# Patient Record
Sex: Male | Born: 1980 | Hispanic: Yes | Marital: Single | Smoking: Current every day smoker
Health system: Southern US, Community
[De-identification: ages and names within clinical notes are randomized; demographics above are authoritative.]

## PROBLEM LIST (undated history)

## (undated) DIAGNOSIS — E119 Type 2 diabetes mellitus without complications: Secondary | ICD-10-CM

## (undated) HISTORY — PX: HAND SURGERY: SHX662

---

## 2016-04-21 ENCOUNTER — Emergency Department (HOSPITAL_COMMUNITY): Payer: Worker's Compensation

## 2016-04-21 ENCOUNTER — Encounter (HOSPITAL_COMMUNITY): Payer: Self-pay | Admitting: *Deleted

## 2016-04-21 ENCOUNTER — Emergency Department (HOSPITAL_COMMUNITY)
Admission: EM | Admit: 2016-04-21 | Discharge: 2016-04-21 | Disposition: A | Discharge status: Home / Self Care | Payer: Worker's Compensation | Attending: Emergency Medicine | Admitting: Emergency Medicine

## 2016-04-21 DIAGNOSIS — Y9289 Other specified places as the place of occurrence of the external cause: Secondary | ICD-10-CM | POA: Insufficient documentation

## 2016-04-21 DIAGNOSIS — Y9389 Activity, other specified: Secondary | ICD-10-CM | POA: Diagnosis not present

## 2016-04-21 DIAGNOSIS — Y999 Unspecified external cause status: Secondary | ICD-10-CM | POA: Diagnosis not present

## 2016-04-21 DIAGNOSIS — W293XXA Contact with powered garden and outdoor hand tools and machinery, initial encounter: Secondary | ICD-10-CM | POA: Insufficient documentation

## 2016-04-21 DIAGNOSIS — S96021A Laceration of muscle and tendon of long flexor muscle of toe at ankle and foot level, right foot, initial encounter: Secondary | ICD-10-CM | POA: Diagnosis not present

## 2016-04-21 DIAGNOSIS — S92411B Displaced fracture of proximal phalanx of right great toe, initial encounter for open fracture: Secondary | ICD-10-CM

## 2016-04-21 DIAGNOSIS — S91119A Laceration without foreign body of unspecified toe without damage to nail, initial encounter: Secondary | ICD-10-CM

## 2016-04-21 DIAGNOSIS — S99921A Unspecified injury of right foot, initial encounter: Secondary | ICD-10-CM | POA: Diagnosis present

## 2016-04-21 DIAGNOSIS — Z23 Encounter for immunization: Secondary | ICD-10-CM | POA: Diagnosis not present

## 2016-04-21 DIAGNOSIS — F17200 Nicotine dependence, unspecified, uncomplicated: Secondary | ICD-10-CM | POA: Diagnosis not present

## 2016-04-21 DIAGNOSIS — E119 Type 2 diabetes mellitus without complications: Secondary | ICD-10-CM | POA: Diagnosis not present

## 2016-04-21 HISTORY — DX: Type 2 diabetes mellitus without complications: E11.9

## 2016-04-21 LAB — CBG MONITORING, ED: GLUCOSE-CAPILLARY: 110 mg/dL — AB (ref 65–99)

## 2016-04-21 MED ORDER — AMOXICILLIN-POT CLAVULANATE 875-125 MG PO TABS: 1.0000 | ORAL_TABLET | Freq: Two times a day (BID) | ORAL | 0 refills | Status: AC

## 2016-04-21 MED ORDER — HYDROCODONE-ACETAMINOPHEN 5-325 MG PO TABS: 1.0000 | ORAL_TABLET | Freq: Four times a day (QID) | ORAL | 0 refills | Status: AC | PRN

## 2016-04-21 MED ORDER — CEFAZOLIN SODIUM-DEXTROSE 2-4 GM/100ML-% IV SOLN
2.0000 g | Freq: Once | INTRAVENOUS | Status: AC
Start: 2016-04-21 — End: 2016-04-21
  Administered 2016-04-21: 2 g via INTRAVENOUS
  Filled 2016-04-21: qty 100

## 2016-04-21 MED ORDER — TETANUS-DIPHTH-ACELL PERTUSSIS 5-2.5-18.5 LF-MCG/0.5 IM SUSP
0.5000 mL | Freq: Once | INTRAMUSCULAR | Status: AC
  Administered 2016-04-21: 0.5 mL via INTRAMUSCULAR
  Filled 2016-04-21: qty 0.5

## 2016-04-21 MED ORDER — HYDROCODONE-ACETAMINOPHEN 5-325 MG PO TABS
1.0000 | ORAL_TABLET | Freq: Once | ORAL | Status: AC
  Administered 2016-04-21: 1 via ORAL
  Filled 2016-04-21: qty 1

## 2016-04-21 MED ORDER — LIDOCAINE HCL (PF) 1 % IJ SOLN
5.0000 mL | Freq: Once | INTRAMUSCULAR | Status: AC
  Administered 2016-04-21: 5 mL
  Filled 2016-04-21: qty 5

## 2016-04-21 NOTE — ED Notes (Signed)
Patient transported to X-ray 

## 2016-04-21 NOTE — ED Notes (Signed)
Translator at bedside, edp made aware

## 2016-04-21 NOTE — ED Notes (Signed)
Ortho at bedside.

## 2016-04-21 NOTE — Discharge Instructions (Addendum)
Wash wounds daily in shower with soap and water. Do not soak. Apply antibiotic ointment (e.g. Neosporin) twice daily and as needed to keep moist. Cover with dry dressing.  Walk as tolerated in hard-soled shoe.  No work until you see Dr. Magnus Ivan in office.  Lave las heridas diariamente en la ducha con agua y [place]. No remojar. Aplique ungento antibitico (por ejemplo, Neosporin) dos veces al da y segn sea necesario para mantenerse hmedo. Cubra con aderezo seco.  Camine segn lo tolere un zapato con suela dura.  No trabajar hasta que vea al Dr. Magnus Ivan en el cargo.

## 2016-04-21 NOTE — ED Triage Notes (Signed)
Pt states was using chainsaw to cut tree and saw broke, cutting through boot and 2 pairs of soc.  Lac noted to great toe.  Pt states able to move.  Hx of diabetes that pt does not take meds for.  cbg 110 in triage.

## 2016-04-21 NOTE — ED Provider Notes (Signed)
[facility]-EMERGENCY DEPT Provider Note   CSN: [id number] Arrival date & time: 04/21/16  1102     History   Chief Complaint Chief Complaint  Patient presents with  . Extremity Laceration    HPI Randy Hunter Randy Hunter is a 36 y.o. male.  Pt presents w laceration to R 1st toe. Injury occurred just prior to arrival; pt was using a chainsaw at work when it "bumped around" and landed on his toe. Pt reports pain is constant, 7/10 severity, w numbness surrounding the wound. Denies any other trauma. Denies daily medications. Last tetanus vaccine was at age 35. Per chart, PMHx DM, pt reports this is new to him as of today. Current everyday smoker.    Due to language barrier, an interpreter was present during the history-taking and subsequent discussion (and for part of the physical exam) with this patient.   Past Medical History:  Diagnosis Date  . Diabetes mellitus without complication (HCC)     There are no active problems to display for this patient.   Past Surgical History:  Procedure Laterality Date  . HAND SURGERY Right        Home Medications    Prior to Admission medications   Medication Sig Start Date End Date Taking? Authorizing Provider  amoxicillin-clavulanate (AUGMENTIN) 875-125 MG tablet Take 1 tablet by mouth every 12 (twelve) hours. 04/21/16   [place] N Russo, PA-C  HYDROcodone-acetaminophen (NORCO/VICODIN) 5-325 MG tablet Take 1-2 tablets by mouth every 6 (six) hours as needed for severe pain. 04/21/16   [place] N Russo, PA-C    Family History No family history on file.  Social History Social History  Substance Use Topics  . Smoking status: Current Every Day Smoker    Packs/day: 0.50  . Smokeless tobacco: Never Used  . Alcohol use Yes     Comment: occ     Allergies   Patient has no known allergies.   Review of Systems Review of Systems  Musculoskeletal: Positive for arthralgias (R 1st toe).  Skin: Positive for wound (R 1st toe).  Neurological:  Positive for numbness.     Physical Exam Updated Vital Signs BP 121/85   Pulse 63   Temp 97.8 F (36.6 C) (Oral)   Resp 18   Ht  (1.93 m)   Wt 77.1 kg   SpO2 100%   BMI 20.69 kg/m   Physical Exam  Constitutional: He appears well-developed and well-nourished.  HENT:  Head: Normocephalic and atraumatic.  Eyes: Conjunctivae are normal.  Cardiovascular: Normal rate and intact distal pulses.   Pulmonary/Chest: Effort normal.  Musculoskeletal: He exhibits tenderness. He exhibits no deformity.  Pt w dec AROM of R 1st toe, assoc edema.   Neurological: A sensory deficit (dec sensation medial and lateral aspect R ankle and R 1st toe, normal sensation dorsal foot) is present.  Skin:  2-2.5cm laceration dorsal aspect of base of 1st R toe.   Psychiatric: He has a normal mood and affect. His behavior is normal.  Nursing note and vitals reviewed.    ED Treatments / Results  Labs (all labs ordered are listed, but only abnormal results are displayed) Labs Reviewed  CBG MONITORING, ED - Abnormal; Notable for the following:       Result Value   Glucose-Capillary 110 (*)    All other components within normal limits    EKG  EKG Interpretation None       Radiology Dg Foot Complete Right  Result Date: 04/21/2016 CLINICAL DATA:  Using  saw today patient cut rt big toe EXAM: RIGHT FOOT COMPLETE - 3+ VIEW COMPARISON:  None. FINDINGS: Three views of the right foot submitted. Consistent with provided history there is a linear oblique cortical defect medial aspect mid shaft of proximal phalanx great toe. Open fracture is highly suspected probable from bony injury from saw blade. IMPRESSION: Consistent with provided history there is a linear oblique cortical defect medial aspect mid shaft of proximal phalanx great toe. Open fracture is highly suspected probable from bony injury from saw blade. These results were called by telephone at the time of interpretation on 04/21/2016 at 12:40 pm to  Dr. Crista Curb , who verbally acknowledged these results. Electronically Signed   By: Natasha Mead M.D.   On: 04/21/2016 12:40    Procedures .Marland KitchenLaceration Repair Date/Time: 04/21/2016 4:16 PM Performed by: RUSSO, [place] N Authorized by: RUSSO, [place] N   Consent:    Consent obtained:  Verbal   Consent given by:  Patient   Risks discussed:  Infection, retained foreign body, tendon damage, pain and poor wound healing   Alternatives discussed:  No treatment Anesthesia (see MAR for exact dosages):    Anesthesia method:  Local infiltration   Local anesthetic:  Lidocaine 1% w/o epi Laceration details:    Location:  Toe   Toe location:  R big toe   Length (cm):  2.5   Depth (mm):  0.5 Repair type:    Repair type:  Simple Pre-procedure details:    Preparation:  Patient was prepped and draped in usual sterile fashion Exploration:    Hemostasis achieved with:  Direct pressure   Wound exploration: wound explored through full range of motion and entire depth of wound probed and visualized     Wound extent: tendon damage     Wound extent: no foreign bodies/material noted     Tendon damage location:  Lower extremity   Lower extremity tendon damage location:  Extensor tendon of 1st toe   Tendon damage extent:  Partial transection   Tendon repair plan:  Refer for evaluation   Contaminated: no   Treatment:    Area cleansed with:  Saline   Amount of cleaning:  Extensive   Irrigation solution:  Sterile saline   Irrigation volume:  1L   Irrigation method:  Syringe   Visualized foreign bodies/material removed: no   Skin repair:    Repair method:  Sutures   Suture size:  5-0   Suture material:  Prolene   Suture technique:  Simple interrupted   Number of sutures:  7 Approximation:    Approximation:  Loose   Vermilion border: well-aligned   Post-procedure details:    Dressing:  Non-adherent dressing   Patient tolerance of procedure:  Tolerated well, no immediate complications   (including  critical care time)  Medications Ordered in ED Medications  Tdap (BOOSTRIX) injection 0.5 mL (0.5 mLs Intramuscular Given 04/21/16 1321)  HYDROcodone-acetaminophen (NORCO/VICODIN) 5-325 MG per tablet 1 tablet (1 tablet Oral Given 04/21/16 1321)  ceFAZolin (ANCEF) IVPB 2g/100 mL premix (0 g Intravenous Stopped 04/21/16 1606)  lidocaine (PF) (XYLOCAINE) 1 % injection 5 mL (5 mLs Infiltration Given 04/21/16 1617)     Initial Impression / Assessment and Plan / ED Course  I have reviewed the triage vital signs and the nursing notes.  Pertinent labs & imaging results that were available during my care of the patient were reviewed by me and considered in my medical decision making (see chart for details).  Pt w laceration to base of R 1st toe w assoc cortical defect and possible open fracture. Pressure irrigation performed. Wound explored and base of wound visualized in a bloodless field without evidence of foreign body. Tendon damage noted. Repaired lac w loose approximation, well-aligned. Laceration occurred < 8 hours prior to ED arrival. Tdap updated. Orthopedic surgery consulted and suggest orthopedic boot, dose of IV abx and outpatient follow up. Will discharge w Augmentin, Norco for pain, boot and referral.   Discussed suture home care with patient and answered questions. Pt to follow-up w orthopedic surgery. They are to return to the ED sooner for signs of infection. Pt is hemodynamically stable with no complaints prior to dc.   Patient discussed with and seen by Dr. Verdie Mosher. Discussed results, findings, treatment and follow up. Patient advised of return precautions. Patient verbalized understanding and agreed with plan.    Final Clinical Impressions(s) / ED Diagnoses   Final diagnoses:  Laceration of toe of right foot with complication, initial encounter  Open fracture of proximal phalanx of right great toe, physeal involvement unspecified, initial encounter    New Prescriptions New  Prescriptions   AMOXICILLIN-CLAVULANATE (AUGMENTIN) 875-125 MG TABLET    Take 1 tablet by mouth every 12 (twelve) hours.   HYDROCODONE-ACETAMINOPHEN (NORCO/VICODIN) 5-325 MG TABLET    Take 1-2 tablets by mouth every 6 (six) hours as needed for severe pain.     [place] N Russo, PA-C 04/21/16 1626    Lavera Guise, MD 04/21/16 2023

## 2016-05-01 ENCOUNTER — Ambulatory Visit (INDEPENDENT_AMBULATORY_CARE_PROVIDER_SITE_OTHER): Payer: Self-pay

## 2016-05-01 ENCOUNTER — Ambulatory Visit (INDEPENDENT_AMBULATORY_CARE_PROVIDER_SITE_OTHER): Payer: Self-pay | Admitting: Orthopedic Surgery

## 2016-05-01 DIAGNOSIS — S96921D Laceration of unspecified muscle and tendon at ankle and foot level, right foot, subsequent encounter: Secondary | ICD-10-CM

## 2016-05-01 DIAGNOSIS — S96121A Laceration of muscle and tendon of long extensor muscle of toe at ankle and foot level, right foot, initial encounter: Secondary | ICD-10-CM

## 2016-05-01 DIAGNOSIS — M79674 Pain in right toe(s): Secondary | ICD-10-CM

## 2016-05-01 DIAGNOSIS — S96821D Laceration of other specified muscles and tendons at ankle and foot level, right foot, subsequent encounter: Secondary | ICD-10-CM | POA: Insufficient documentation

## 2016-05-01 NOTE — Progress Notes (Signed)
Office Visit Note   Patient: Randy Hunter           Date of Birth: Feb 05, 1980           MRN: [id number] Visit Date: 05/01/2016              Requested by: No referring provider defined for this encounter. PCP: No PCP Per Patient  No chief complaint on file.     HPI: Patient is a 36 year old gentleman who was using a chainsaw the chainsaw cut through his shoe and through this skin of his foot. Patient was seen in emergency room the wound was washed out the skin was closed and patient is seen today for initial evaluation. Initial injury was last Thursday. Patient did receive a tetanus shot and IV antibiotics.  Assessment & Plan: Visit Diagnoses:  1. Great toe pain, right   2. Laceration of extensor hallucis longus tendon, right, initial encounter     Plan: Recommend proceeding with exploration and reconstruction of extensor hallux  longus tendon. Patient states he is not interested in surgery at this time we will harvest the sutures keep him in the postoperative shoe follow-up in the office in 3 weeks. I discussed that with waiting the tendon will contract and would be more difficult to consider reconstruction at a later date.  Follow-Up Instructions: Return in about 3 weeks (around 05/22/2016).   Ortho Exam  Patient is alert, oriented, no adenopathy, well-dressed, normal affect, normal respiratory effort. Examination patient does have an antalgic gait he has a good dorsalis pedis and posterior tibial pulse. In comparing the right and left toe side-by-side there is slight increased plantar flexion on the right toe compared to left. The left has excellent dorsiflexion the right toe only has dorsiflexion of about 20 he has good plantar flexion as well. I can palpate the EHL and this has not contracted.  Imaging: Xr Toe Great Right  Result Date: 05/01/2016 Three-view radiographs of the right foot shows a cortical defect from what appears where a chainsaw went through half of  the proximal phalanx of the right great toe. There is no displaced fracture. The defect goes from the medial border to midline of the proximal phalanx. Defect is in the middle aspect of the proximal phalanx.   Labs: No results found for: HGBA1C, ESRSEDRATE, CRP, LABURIC, REPTSTATUS, GRAMSTAIN, CULT, LABORGA  Orders:  Orders Placed This Encounter  Procedures  . XR Toe Great Right   No orders of the defined types were placed in this encounter.    Procedures: No procedures performed  Clinical Data: No additional findings.  ROS:  All other systems negative, except as noted in the HPI. Review of Systems  Objective: Vital Signs: There were no vitals taken for this visit.  Specialty Comments:  No specialty comments available.  PMFS History: Patient Active Problem List   Diagnosis Date Noted  . Laceration of extensor hallucis longus tendon, right, initial encounter 05/01/2016   Past Medical History:  Diagnosis Date  . Diabetes mellitus without complication (HCC)     No family history on file.  Past Surgical History:  Procedure Laterality Date  . HAND SURGERY Right    Social History   Occupational History  . Not on file.   Social History Main Topics  . Smoking status: Current Every Day Smoker    Packs/day: 0.50  . Smokeless tobacco: Never Used  . Alcohol use Yes     Comment: occ  . Drug use: No  .  Sexual activity: Not on file

## 2016-05-22 ENCOUNTER — Ambulatory Visit (INDEPENDENT_AMBULATORY_CARE_PROVIDER_SITE_OTHER): Payer: Self-pay | Admitting: Family

## 2016-05-22 ENCOUNTER — Encounter (INDEPENDENT_AMBULATORY_CARE_PROVIDER_SITE_OTHER): Payer: Self-pay | Admitting: Orthopedic Surgery

## 2016-05-22 VITALS — Ht 76.0 in | Wt 170.0 lb

## 2016-05-22 DIAGNOSIS — S96821D Laceration of other specified muscles and tendons at ankle and foot level, right foot, subsequent encounter: Secondary | ICD-10-CM

## 2016-05-22 DIAGNOSIS — S96921D Laceration of unspecified muscle and tendon at ankle and foot level, right foot, subsequent encounter: Secondary | ICD-10-CM

## 2016-05-22 NOTE — Progress Notes (Signed)
Office Visit Note   Patient: Randy Hunter           Date of Birth: Jun 06, 1980           MRN: [id number] Visit Date: 05/22/2016              Requested by: No referring provider defined for this encounter. PCP: Patient, No Pcp Per  Chief Complaint  Patient presents with  . Right Great Toe - Follow-up    DOI 04/21/16 right great toe chainsaw laceration injury      HPI: Patient is a 36 year old gentleman who was using a chainsaw. the chainsaw cut through his shoe and through this skin of his foot on 04/21/16. Patient was seen in emergency room the wound was washed out the skin was closed. patient is seen today in follow up. Continues to have some pain and numbness. Pain with flexion and weight bearing in regular shoe wear. Today is in postop shoe.   Has completed a course of amoxicillin. Is using hydrocodone as needed for pain. Of note did decline reconstructive surgery of the extensor hallux longus at last visit.  Assessment & Plan: Visit Diagnoses:  1. Laceration of extensor hallucis longus tendon, right, initial encounter     Plan: discuss that he may resume regular shoewear as comfortable. May find stiff soled walking shoes a postop shoe or comfortable for the next 4 weeks. He'll follow-up in office as needed. States would like to return to work this coming Monday. May return or call with any questions or concerns.  Follow-Up Instructions: No Follow-up on file.   Ortho Exam  Patient is alert, oriented, no adenopathy, well-dressed, normal affect, normal respiratory effort. Examination patient does have an antalgic gait he has a good dorsalis pedis and posterior tibial pulse. The wound has well-healed. There are no open areas noted drainage no erythema sign of infection. The left has excellent dorsiflexion. the right toe only has dorsiflexion of about 30 he has good plantar flexion as well. I can palpate the EHL and this has not contracted.  Imaging: No results  found.  Labs: No results found for: HGBA1C, ESRSEDRATE, CRP, LABURIC, REPTSTATUS, GRAMSTAIN, CULT, LABORGA  Orders:  No orders of the defined types were placed in this encounter.  No orders of the defined types were placed in this encounter.    Procedures: No procedures performed  Clinical Data: No additional findings.  ROS:  All other systems negative, except as noted in the HPI. Review of Systems  Constitutional: Negative for chills and fever.  Musculoskeletal: Positive for arthralgias.  Skin: Negative for wound.  Neurological: Positive for numbness. Negative for weakness.    Objective: Vital Signs: Ht 6\' 4"  (1.93 m)   Wt 170 lb (77.1 kg)   BMI 20.69 kg/m   Specialty Comments:  No specialty comments available.  PMFS History: Patient Active Problem List   Diagnosis Date Noted  . Laceration of extensor hallucis longus tendon, right, initial encounter 05/01/2016   Past Medical History:  Diagnosis Date  . Diabetes mellitus without complication (HCC)     History reviewed. No pertinent family history.  Past Surgical History:  Procedure Laterality Date  . HAND SURGERY Right    Social History   Occupational History  . Not on file.   Social History Main Topics  . Smoking status: Current Every Day Smoker    Packs/day: 0.50  . Smokeless tobacco: Never Used  . Alcohol use Yes     Comment: occ  .  Drug use: No  . Sexual activity: Not on file       

## 2016-06-29 ENCOUNTER — Encounter (INDEPENDENT_AMBULATORY_CARE_PROVIDER_SITE_OTHER): Payer: Self-pay

## 2016-08-03 ENCOUNTER — Telehealth (INDEPENDENT_AMBULATORY_CARE_PROVIDER_SITE_OTHER): Payer: Self-pay | Admitting: Orthopedic Surgery

## 2016-08-03 NOTE — Telephone Encounter (Signed)
LVMOM for patient to call the [facility] back [facility].

## 2016-08-31 ENCOUNTER — Telehealth (INDEPENDENT_AMBULATORY_CARE_PROVIDER_SITE_OTHER): Payer: Self-pay | Admitting: Orthopedic Surgery

## 2016-08-31 NOTE — Telephone Encounter (Signed)
LVM for pt to please give our office a call with WC information.

## 2018-08-01 IMAGING — DX DG FOOT COMPLETE 3+V*R*
3 series · 3 of 3 positions shown · non-contrast
Comparison: None.

CLINICAL DATA: Using saw today patient cut rt big toe

EXAM:
RIGHT FOOT COMPLETE - 3+ VIEW

[foot ap]
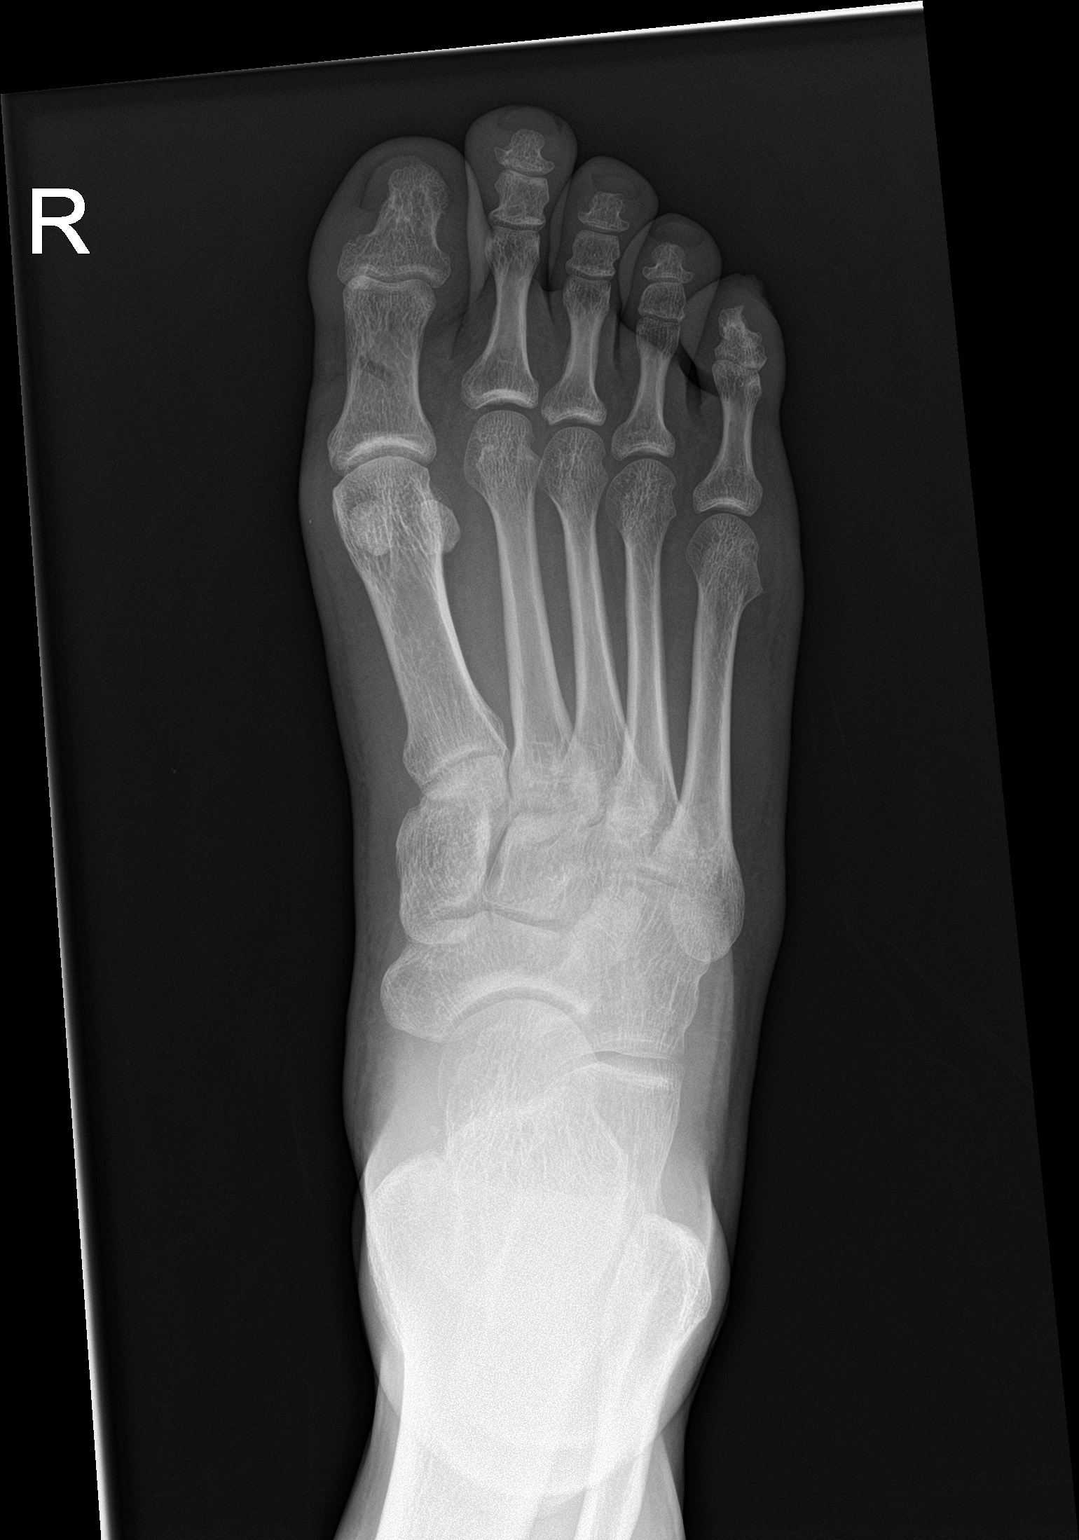

[foot obl]
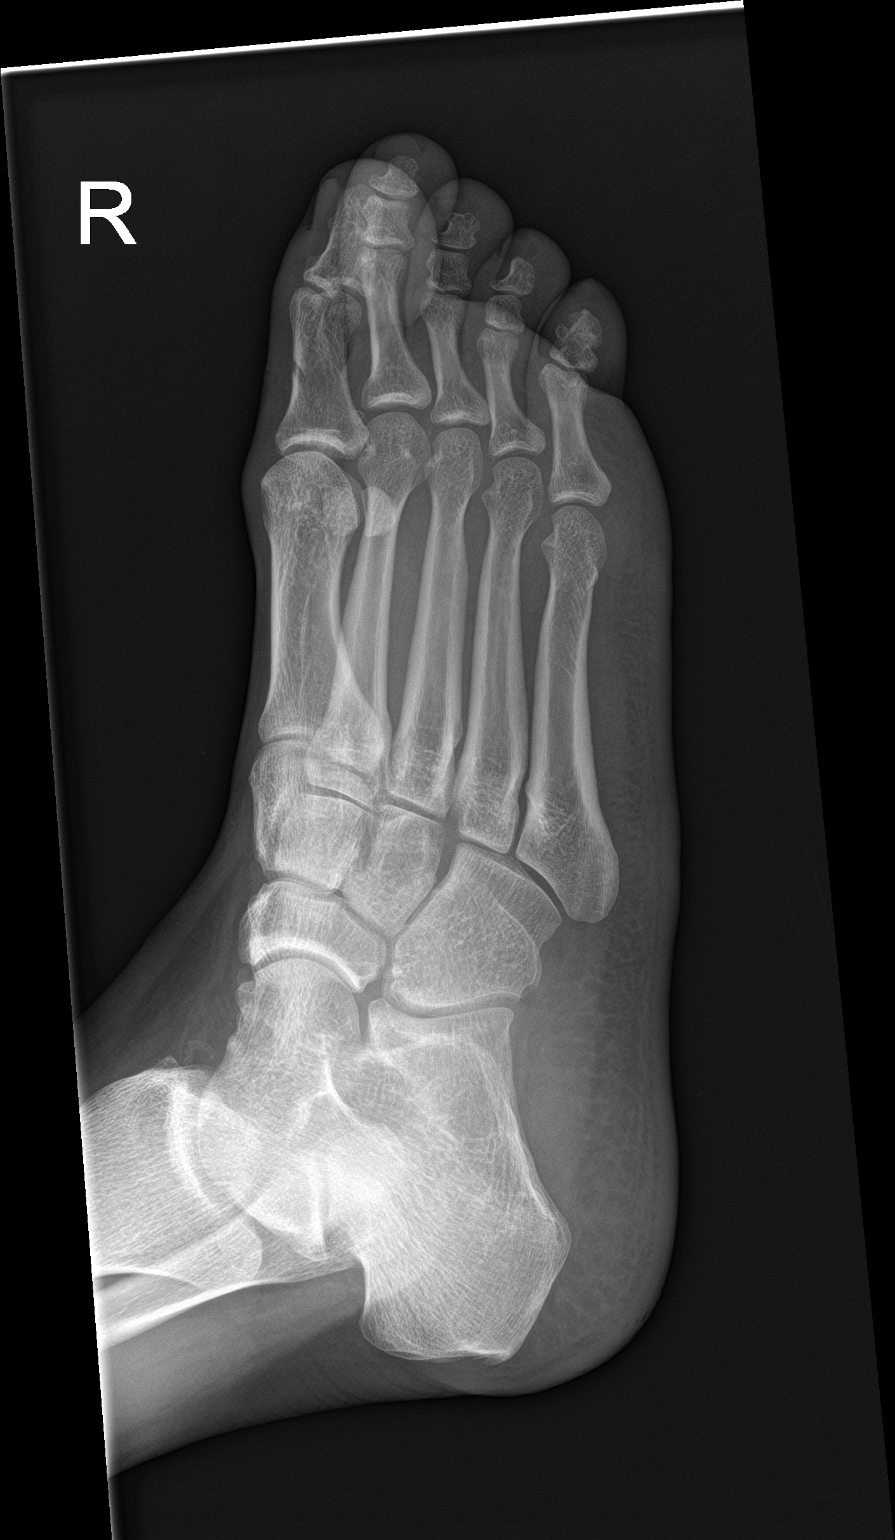

[foot lat]
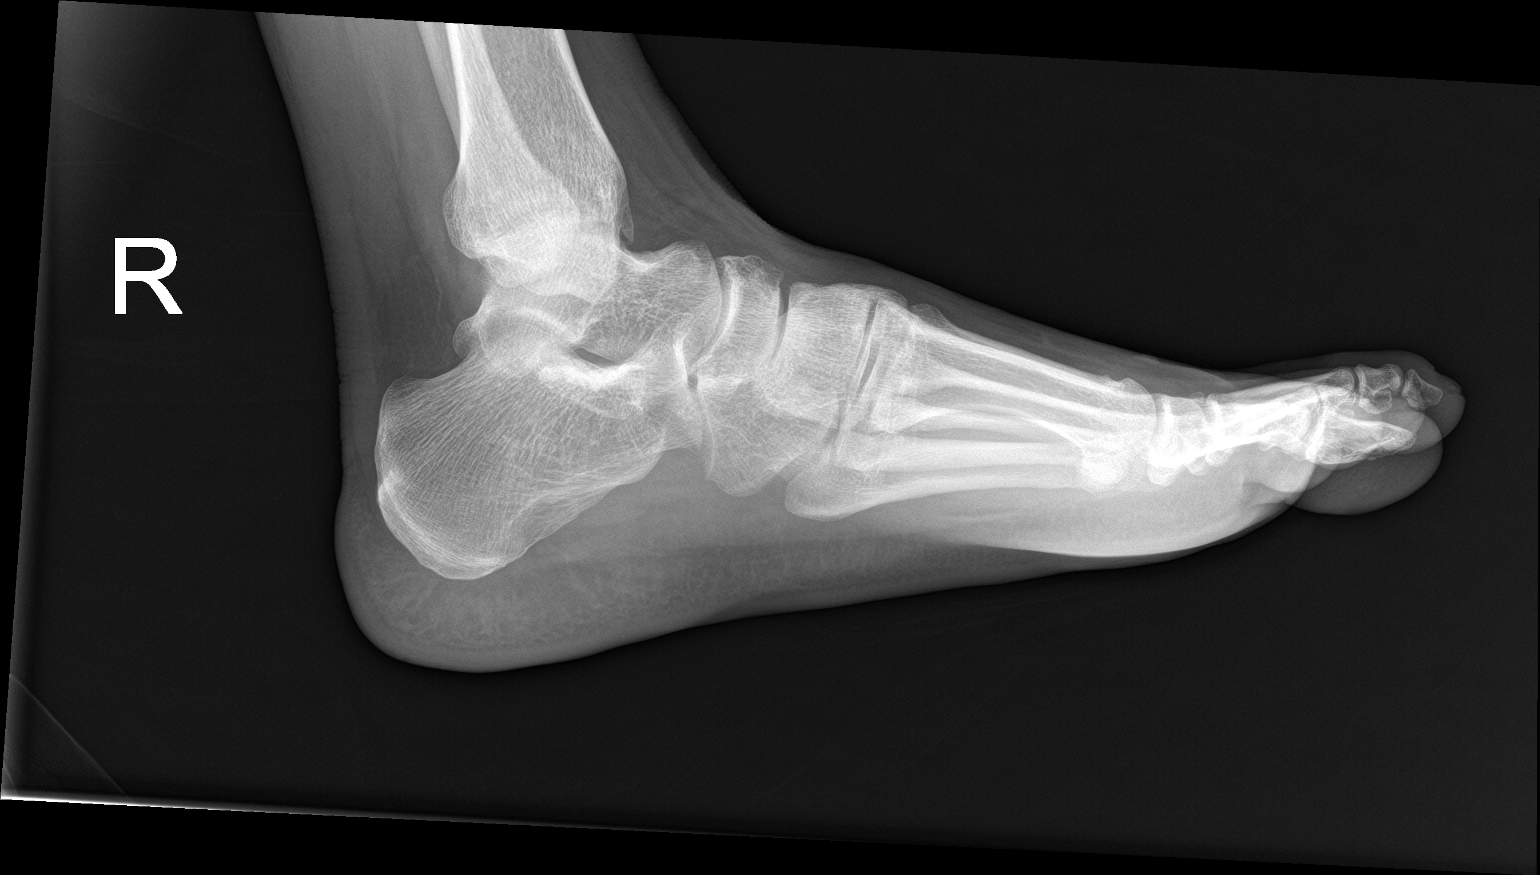

[3 of 3 positions shown; findings below may reference images not displayed]

FINDINGS: Three views of the right foot submitted. Consistent with provided
history there is a linear oblique cortical defect medial aspect mid
shaft of proximal phalanx great toe. Open fracture is highly
suspected probable from bony injury from saw blade.
IMPRESSION: Consistent with provided history there is a linear oblique cortical
defect medial aspect mid shaft of proximal phalanx great toe. Open
fracture is highly suspected probable from bony injury from saw
blade.

These results were called by telephone at the time of interpretation
on 04/21/2016 at [DATE] to Dr. QUIRIJN AMAZIGH , who verbally acknowledged
these results.
